# Patient Record
Sex: Male | Born: 1990 | Race: White | Hispanic: No | Marital: Married | Smoking: Never smoker
Health system: Southern US, Community
[De-identification: ages and names within clinical notes are randomized; demographics above are authoritative.]

## PROBLEM LIST (undated history)

## (undated) DIAGNOSIS — S83005A Unspecified dislocation of left patella, initial encounter: Secondary | ICD-10-CM

## (undated) HISTORY — DX: Unspecified dislocation of left patella, initial encounter: S83.005A

---
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
## 2014-06-23 ENCOUNTER — Ambulatory Visit
Admission: RE | Admit: 2014-06-23 | Discharge: 2014-06-23 | Disposition: A | Discharge status: Home / Self Care | Payer: BC Managed Care – PPO | Source: Ambulatory Visit | Attending: Family Medicine | Admitting: Family Medicine

## 2014-06-23 ENCOUNTER — Other Ambulatory Visit: Payer: Self-pay | Admitting: Family Medicine

## 2014-06-23 DIAGNOSIS — R0602 Shortness of breath: Secondary | ICD-10-CM

## 2014-06-23 DIAGNOSIS — R059 Cough, unspecified: Secondary | ICD-10-CM

## 2014-06-23 DIAGNOSIS — R05 Cough: Secondary | ICD-10-CM

## 2015-02-01 ENCOUNTER — Telehealth: Payer: Self-pay

## 2015-02-01 NOTE — Telephone Encounter (Signed)
Patient called into voicemail on Wed. (6/29) at 4:32pm stating he needs his Immunization records sent to him for his employer [employer]) he has a [employer] account with DOS 12/02/13.  His call back number is [phone] he will need to fill out a ROI and let us know the best way to send him his records.

## 2015-02-08 NOTE — Telephone Encounter (Signed)
Spoke with patient. He only has Tb test and Hep B in his chart DOS 12/02/13 from his [place] visit for [place]. Will copy for pickup. He is going out of town and will pick up records on Tuesday. Will sign ROI form as well.

## 2016-05-05 ENCOUNTER — Encounter: Payer: Self-pay | Admitting: Hematology

## 2016-05-05 ENCOUNTER — Telehealth: Payer: Self-pay | Admitting: Hematology

## 2016-05-05 NOTE — Telephone Encounter (Signed)
Appt scheduled with [name] on 11/2 at 2pm. Patient aware to arrive 30 minutes early. Demographics verified. Letter mailed to the patient.

## 2016-06-05 ENCOUNTER — Ambulatory Visit (HOSPITAL_BASED_OUTPATIENT_CLINIC_OR_DEPARTMENT_OTHER): Payer: BLUE CROSS/BLUE SHIELD

## 2016-06-05 ENCOUNTER — Telehealth: Payer: Self-pay | Admitting: Hematology

## 2016-06-05 ENCOUNTER — Encounter: Payer: Self-pay | Admitting: Hematology

## 2016-06-05 ENCOUNTER — Ambulatory Visit (HOSPITAL_BASED_OUTPATIENT_CLINIC_OR_DEPARTMENT_OTHER): Payer: BLUE CROSS/BLUE SHIELD | Admitting: Hematology

## 2016-06-05 VITALS — BP 132/83 | HR 94 | Temp 98.5°F | Resp 18 | Wt 231.5 lb

## 2016-06-05 DIAGNOSIS — D696 Thrombocytopenia, unspecified: Secondary | ICD-10-CM

## 2016-06-05 DIAGNOSIS — R000 Tachycardia, unspecified: Secondary | ICD-10-CM | POA: Diagnosis not present

## 2016-06-05 DIAGNOSIS — I10 Essential (primary) hypertension: Secondary | ICD-10-CM | POA: Insufficient documentation

## 2016-06-05 DIAGNOSIS — I471 Supraventricular tachycardia: Secondary | ICD-10-CM | POA: Insufficient documentation

## 2016-06-05 LAB — COMPREHENSIVE METABOLIC PANEL
ALBUMIN: 3.8 g/dL (ref 3.5–5.0)
ALK PHOS: 107 U/L (ref 40–150)
ALT: 42 U/L (ref 0–55)
ANION GAP: 8 meq/L (ref 3–11)
AST: 30 U/L (ref 5–34)
BILIRUBIN TOTAL: 0.66 mg/dL (ref 0.20–1.20)
BUN: 12.9 mg/dL (ref 7.0–26.0)
CO2: 25 mEq/L (ref 22–29)
Calcium: 9.3 mg/dL (ref 8.4–10.4)
Chloride: 107 mEq/L (ref 98–109)
Creatinine: 0.8 mg/dL (ref 0.7–1.3)
GLUCOSE: 96 mg/dL (ref 70–140)
POTASSIUM: 4 meq/L (ref 3.5–5.1)
SODIUM: 140 meq/L (ref 136–145)
TOTAL PROTEIN: 7.5 g/dL (ref 6.4–8.3)

## 2016-06-05 LAB — CBC & DIFF AND RETIC
BASO%: 1.2 % (ref 0.0–2.0)
Basophils Absolute: 0.1 10*3/uL (ref 0.0–0.1)
EOS%: 6.9 % (ref 0.0–7.0)
Eosinophils Absolute: 0.5 10*3/uL (ref 0.0–0.5)
HCT: 43.2 % (ref 38.4–49.9)
HEMOGLOBIN: 15.3 g/dL (ref 13.0–17.1)
Immature Retic Fract: 2.2 % — ABNORMAL LOW (ref 3.00–10.60)
LYMPH%: 30 % (ref 14.0–49.0)
MCH: 30.2 pg (ref 27.2–33.4)
MCHC: 35.4 g/dL (ref 32.0–36.0)
MCV: 85.2 fL (ref 79.3–98.0)
MONO#: 0.6 10*3/uL (ref 0.1–0.9)
MONO%: 7.7 % (ref 0.0–14.0)
NEUT%: 54.2 % (ref 39.0–75.0)
NEUTROS ABS: 4.1 10*3/uL (ref 1.5–6.5)
PLATELETS: 105 10*3/uL — AB (ref 140–400)
RBC: 5.07 10*6/uL (ref 4.20–5.82)
RDW: 12.6 % (ref 11.0–14.6)
Retic %: 1.28 % (ref 0.80–1.80)
Retic Ct Abs: 64.9 10*3/uL (ref 34.80–93.90)
WBC: 7.5 10*3/uL (ref 4.0–10.3)
lymph#: 2.3 10*3/uL (ref 0.9–3.3)

## 2016-06-05 LAB — LACTATE DEHYDROGENASE: LDH: 160 U/L (ref 125–245)

## 2016-06-05 LAB — CHCC SMEAR

## 2016-06-05 NOTE — Telephone Encounter (Signed)
Labs added for today per 06/05/16 los. Follow up appointment to be scheduled, depending on lab results, per Dr. Candise CheKale. No follow up appointment with M.d. Requested at this time, per 06/05/16.

## 2016-06-05 NOTE — Patient Instructions (Addendum)
-  Would avoid alcohol use until we are reassured that your platelet counts are stable. -Avoid over-the-counter NSAIDs such as ibuprofen and aspirin Advil Aleve Motrin naproxen. Tylenol would be okay. -We will review the lab tests from today and call you with the results and plan for follow-up. -reasonable to take daily multivitamin

## 2016-06-06 LAB — HEPATITIS C ANTIBODY: Hep C Virus Ab: <0.1 s/co ratio (ref 0.0–0.9)

## 2016-06-06 LAB — HIV ANTIBODY (ROUTINE TESTING W REFLEX): HIV Screen 4th Generation wRfx: NONREACTIVE

## 2016-06-06 LAB — SEDIMENTATION RATE: Sedimentation Rate-Westergren: 7 mm/hr (ref 0–15)

## 2016-07-04 ENCOUNTER — Telehealth: Payer: Self-pay

## 2016-07-04 NOTE — Telephone Encounter (Signed)
Patient came in today requesting lab results from 06/05/16. Labs review by MD and given to pt. Pt requested information about next appointment, states he has been in multiple times and called and is unsure of what to do next. Spoke with MD who states pt will most likely be referred back to PCP for follow ups and our office will call him with this information. Informed pt of this and informed pt call back in a week or two if he doesn't hear anything back. Pt verbalized understanding.

## 2016-12-07 NOTE — Progress Notes (Signed)
Marland Kitchen    HEMATOLOGY/ONCOLOGY CONSULTATION NOTE  Date of Service: .06/05/2016  PCP:  Harlan Stains MD CHIEF COMPLAINTS/PURPOSE OF CONSULTATION:  Thrombocytopenia  HISTORY OF PRESENTING ILLNESS:  JOHNCHARLES FUSSELMAN is a wonderful 26 y.o. male who has been referred to [place] by Dr Harlan Stains MD for evaluation and management of thrombocytopenia.  Patient has a history of hypertension, supraventricular tachycardia, obesity and has been in his usual state of health. Patient recently had labs with his primary care physician on 04/25/2016 that showed degrees platelet count of 85k with a normal hemoglobin of 15.6 with an MCV of 86 and normal WBC count of 7.6k.  Labs previous to this from 04/10/2016 showed a platelet count of 102k  Patient has not had any issues with bleeding excessive bruising. No gum bleeds no epistaxis. No GI bleeding. No hematuria.  He notes that he has been using over-the-counter ibuprofen for his knee pain as needed and has also been on and over-the-counter PPI for acid reflux. We discussed that both of these have been associated with thrombocytopenia.  He notes that he also had foot poisoning about one half weeks ago.  Has pets at home but denies any tick bites. Notes that he had noted swollen painful arm in mid-September and did have some flulike symptoms.  No fevers no chills no night sweats. No enlarged lymph nodes. No abdominal pain or distention.  MEDICAL HISTORY:   #1 hypertension patient is on metoprolol and noted causes some fatigue #2 supraventricular tachycardias 1-2 times per month #3 obesity #4 dislocated patella #5 external hemorrhoid December 2012  SURGICAL HISTORY: No previous surgeries   SOCIAL HISTORY: Social History   Social History  . Marital status: Married    Spouse name: N/A  . Number of children: N/A  . Years of education: N/A   Occupational History  . Not on file.   Social History Main Topics  . Smoking status: Never Smoker  . Smokeless  tobacco: Never Used  . Alcohol use Yes     Comment: occasionally  . Drug use: No  . Sexual activity: Not on file   Other Topics Concern  . Not on file   Social History Narrative  . No narrative on file  Patient is recently married Nonsmoker Social alcohol use No recreational drug use  FAMILY HISTORY:  Paternal grandmother -breast cancer  Maternal great-grandmother breast cancer Father basal cell carcinoma/squamous cell carcinoma Mom brain and spinal benign tumors.   ALLERGIES:  has No Known Allergies.  MEDICATIONS:  Current Outpatient Prescriptions  Medication Sig Dispense Refill  . metoprolol tartrate (LOPRESSOR) 25 MG tablet Take 25 mg by mouth 2 (two) times daily.     No current facility-administered medications for this visit.   Ibuprofen and over-the-counter PPIs as needed.   REVIEW OF SYSTEMS:    10 Point review of Systems was done is negative except as noted above.  PHYSICAL EXAMINATION: ECOG PERFORMANCE STATUS: 0 - Asymptomatic  . Vitals:   06/05/16 1420  BP: 132/83  Pulse: 94  Resp: 18  Temp: 98.5 F (36.9 C)   Filed Weights   06/05/16 1420  Weight: 231 lb 8 oz (105 kg)   .There is no height or weight on file to calculate BMI.  GENERAL:alert, in no acute distress and comfortable SKIN: no acute rashes, no significant lesions EYES: conjunctiva are pink and non-injected, sclera anicteric OROPHARYNX: MMM, no exudates, no oropharyngeal erythema or ulceration NECK: supple, no JVD LYMPH:  no palpable lymphadenopathy in the  cervical, axillary or inguinal regions LUNGS: clear to auscultation b/l with normal respiratory effort HEART: regular rate & rhythm ABDOMEN:  normoactive bowel sounds , non tender, not distended. No palpable hepatosplenomegaly. Extremity: no pedal edema PSYCH: alert & oriented x 3 with fluent speech NEURO: no focal motor/sensory deficits  LABORATORY DATA:  I have reviewed the data as listed  . CBC Latest Ref Rng & Units  06/05/2016  WBC 4.0 - 10.3 10e3/uL 7.5  Hemoglobin 13.0 - 17.1 g/dL 15.3  Hematocrit 38.4 - 49.9 % 43.2  Platelets 140 - 400 10e3/uL 105(L)    . CMP Latest Ref Rng & Units 06/05/2016  Glucose 70 - 140 mg/dl 96  BUN 7.0 - 26.0 mg/dL 12.9  Creatinine 0.7 - 1.3 mg/dL 0.8  Sodium 136 - 145 mEq/L 140  Potassium 3.5 - 5.1 mEq/L 4.0  CO2 22 - 29 mEq/L 25  Calcium 8.4 - 10.4 mg/dL 9.3  Total Protein 6.4 - 8.3 g/dL 7.5  Total Bilirubin 0.20 - 1.20 mg/dL 0.66  Alkaline Phos 40 - 150 U/L 107  AST 5 - 34 U/L 30  ALT 0 - 55 U/L 42   Component     Latest Ref Rng & Units 06/05/2016  LDH     125 - 245 U/L 160  Sed Rate     0 - 15 mm/hr 7  HIV     Non Reactive Non Reactive  Hep C Virus Ab     0.0 - 0.9 s/co ratio <0.1   RADIOGRAPHIC STUDIES: I have personally reviewed the radiological images as listed and agreed with the findings in the report. No results found.  ASSESSMENT & PLAN:   26 year old male with  #1 mild asymptomatic thrombocytopenic. His platelet count today is 105k with no other CBC abnormalities. Since September his platelet counts have varied between 85-105k with no issues with bleeding or bruising.  This could be due to his recent flulike illness in September, the use of over-the-counter medications including ibuprofen and PPIs or possibly his other medications including metoprolol.  This could also represent an early immune thrombocytopenia do at this point his counts are more than 100k and it would not meet the criteria for ITP but could evolve into this.  HIV, hepatitis C negative LDH is within normal limits and suggests no concurrent hemolysis or evidence of lymphoproliferative disorder. Sedimentation rate is within normal limits. No clinical evidence of autoimmune condition.  Plan -Lab results were discussed in detail with the patient. -No overt platelet clumping on peripheral blood smear to suggest pseudothrombocytopenia -Patient was recommended to avoid using  ibuprofen-like NSAIDs and could use acetaminophen instead for pain. -No indication for bone marrow biopsy at this time. -Repeat CBC in 3-4 months with primary care physician . If still more than 100 k could monitor platelets every 6 months . -Please reconsult [place] if platelet counts dropped to below 75 k -Patient advised to empirically take over-the-counter multivitamin .  Return to clinic with Dr. Irene Limbo on as-needed basis   All of the patients questions were answered with apparent satisfaction. The patient knows to call the clinic with any problems, questions or concerns.  I spent 40 minutes counseling the patient face to face. The total time spent in the appointment was 50 minutes and more than 50% was on counseling and direct patient cares.    Sullivan Lone MD [place] AAHIVMS [facility] Hematology/Oncology Physician [facility]  (Office):       [phone] (Work cell):  [phone] (Fax):           [phone]

## 2019-06-27 ENCOUNTER — Emergency Department
Admission: EM | Admit: 2019-06-27 | Discharge: 2019-06-27 | Discharge status: Absent without leave | Payer: BLUE CROSS/BLUE SHIELD

## 2019-06-27 ENCOUNTER — Other Ambulatory Visit
Admission: RE | Admit: 2019-06-27 | Discharge: 2019-06-27 | Disposition: A | Discharge status: Home / Self Care | Payer: Self-pay | Source: Ambulatory Visit | Attending: Family Medicine | Admitting: Family Medicine

## 2021-08-15 ENCOUNTER — Other Ambulatory Visit: Payer: Self-pay | Admitting: Physician Assistant

## 2021-08-15 ENCOUNTER — Ambulatory Visit
Admission: RE | Admit: 2021-08-15 | Discharge: 2021-08-15 | Disposition: A | Discharge status: Home / Self Care | Payer: BC Managed Care – PPO | Source: Ambulatory Visit | Attending: Physician Assistant | Admitting: Physician Assistant

## 2021-08-15 DIAGNOSIS — S99911A Unspecified injury of right ankle, initial encounter: Secondary | ICD-10-CM

## 2021-08-15 DIAGNOSIS — M25471 Effusion, right ankle: Secondary | ICD-10-CM

## 2022-08-29 IMAGING — CR DG ANKLE COMPLETE 3+V*R*
3 series · 3 of 3 positions shown · non-contrast
Comparison: None.

CLINICAL DATA: Right ankle pain.

EXAM:
RIGHT ANKLE - COMPLETE 3+ VIEW

[t ankle joint ap right (1 of 2)]
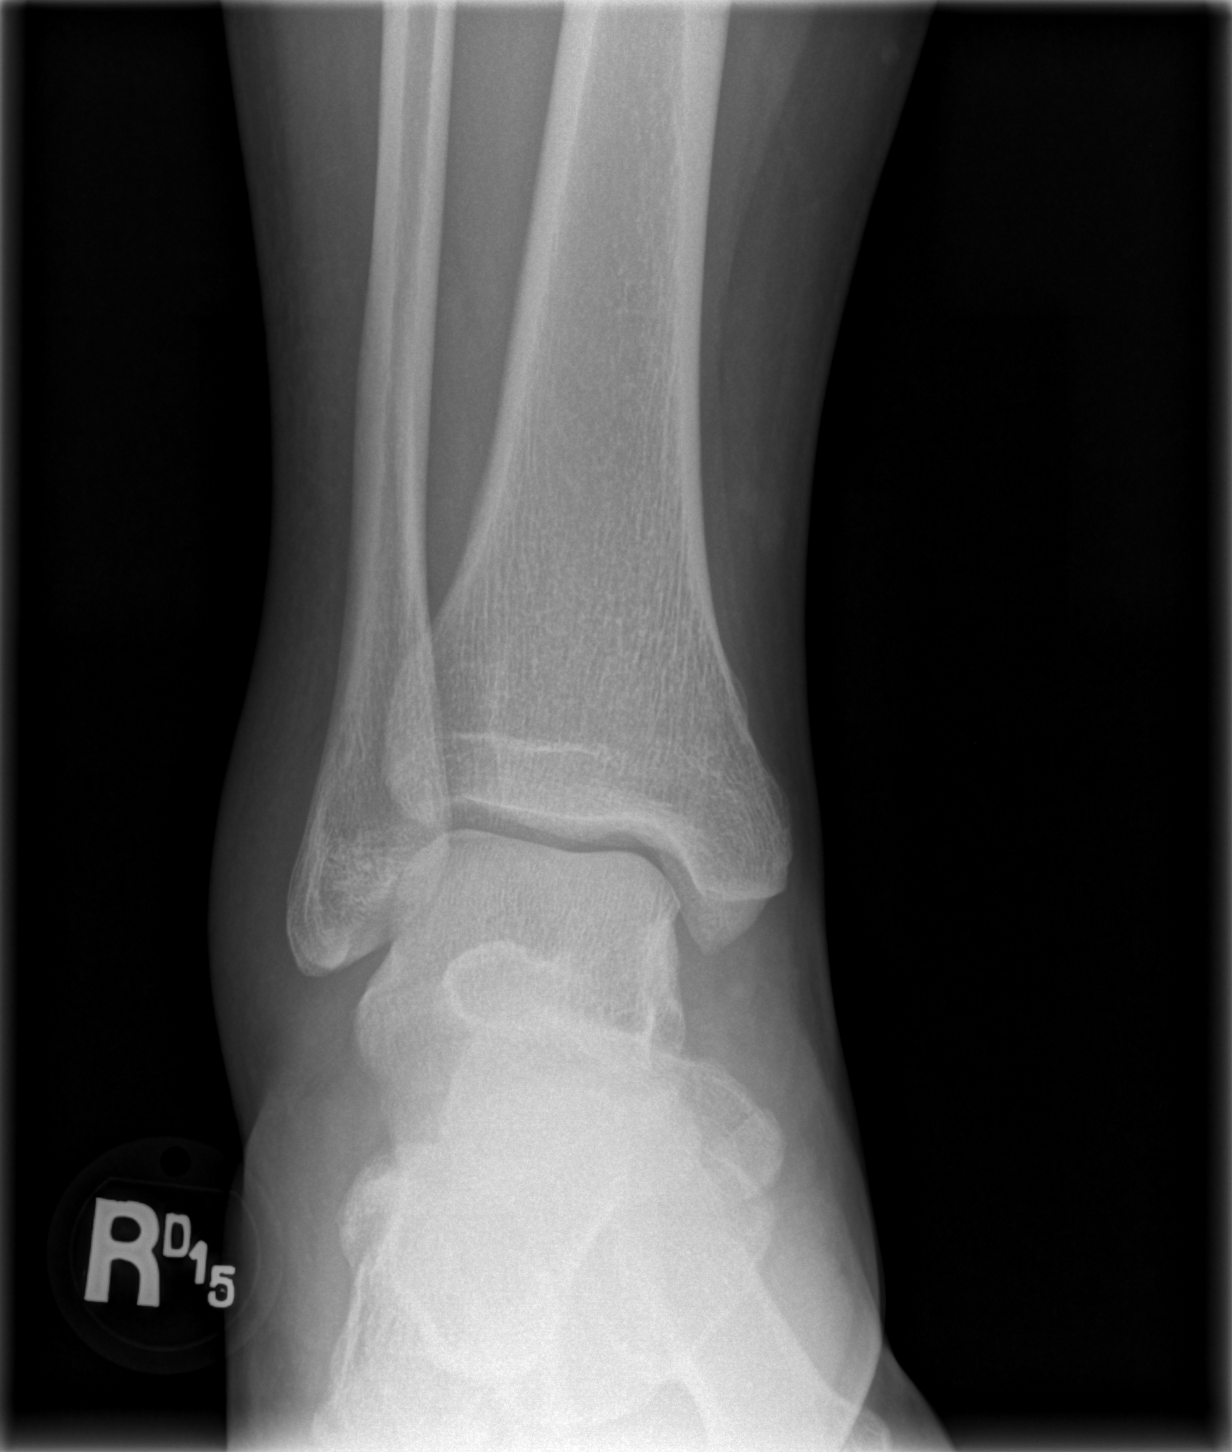

[t ankle joint ap right (2 of 2)]
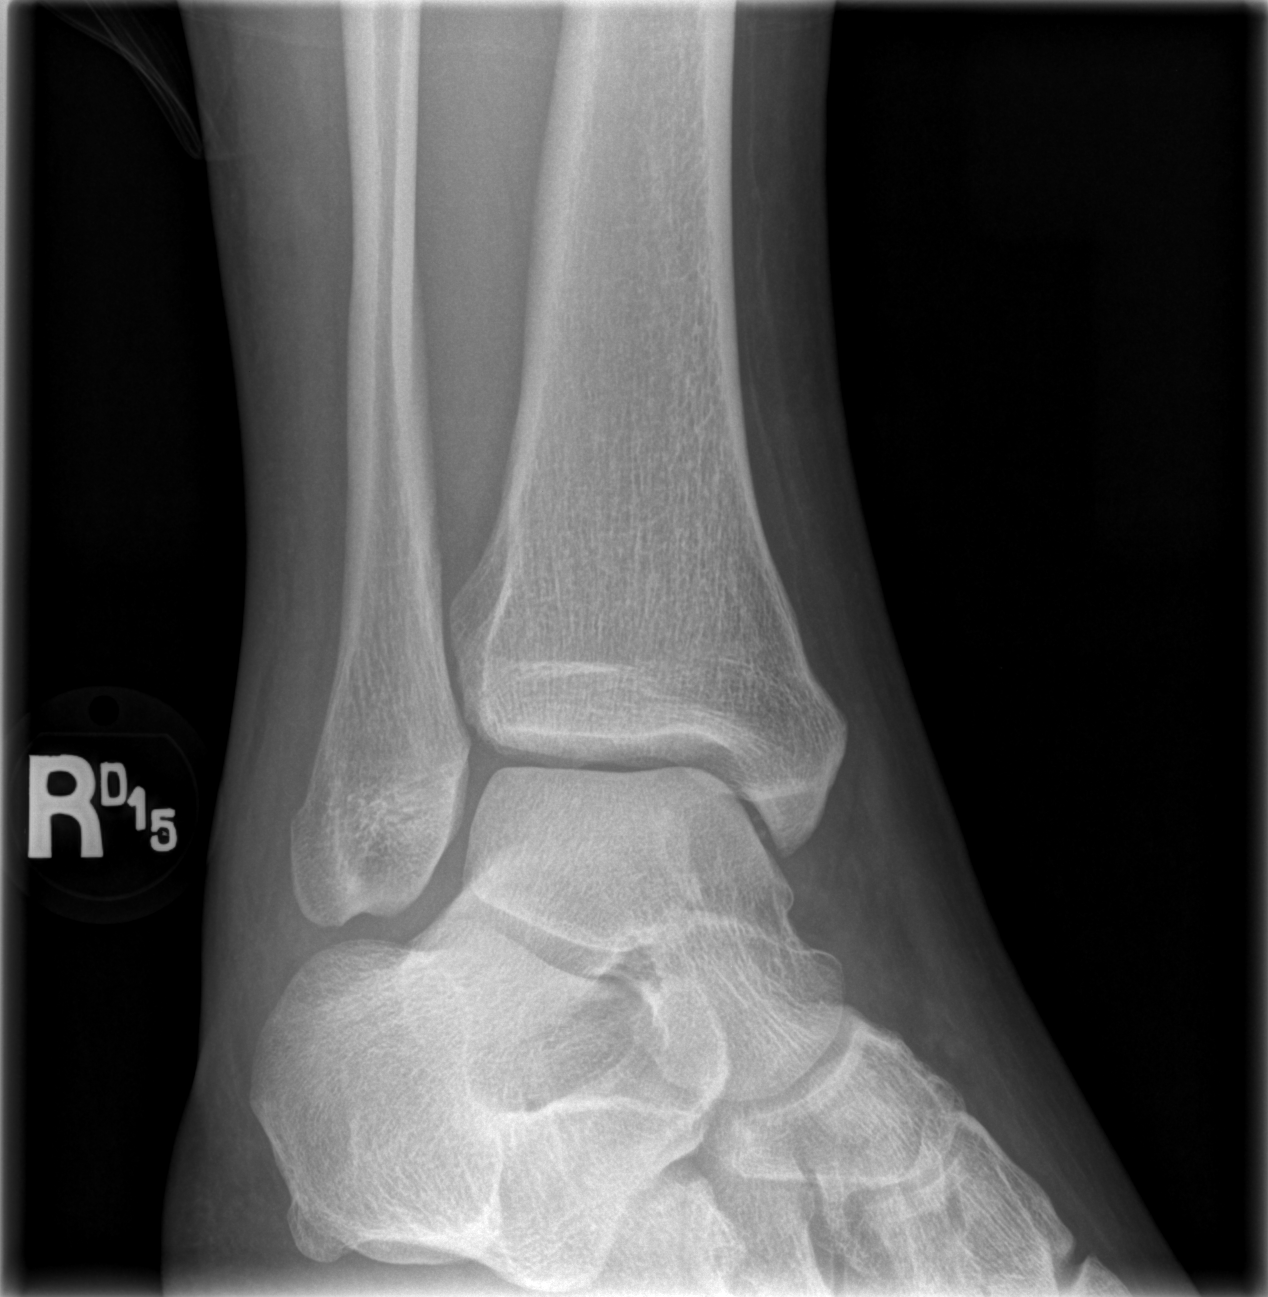

[t ankle joint lat right]
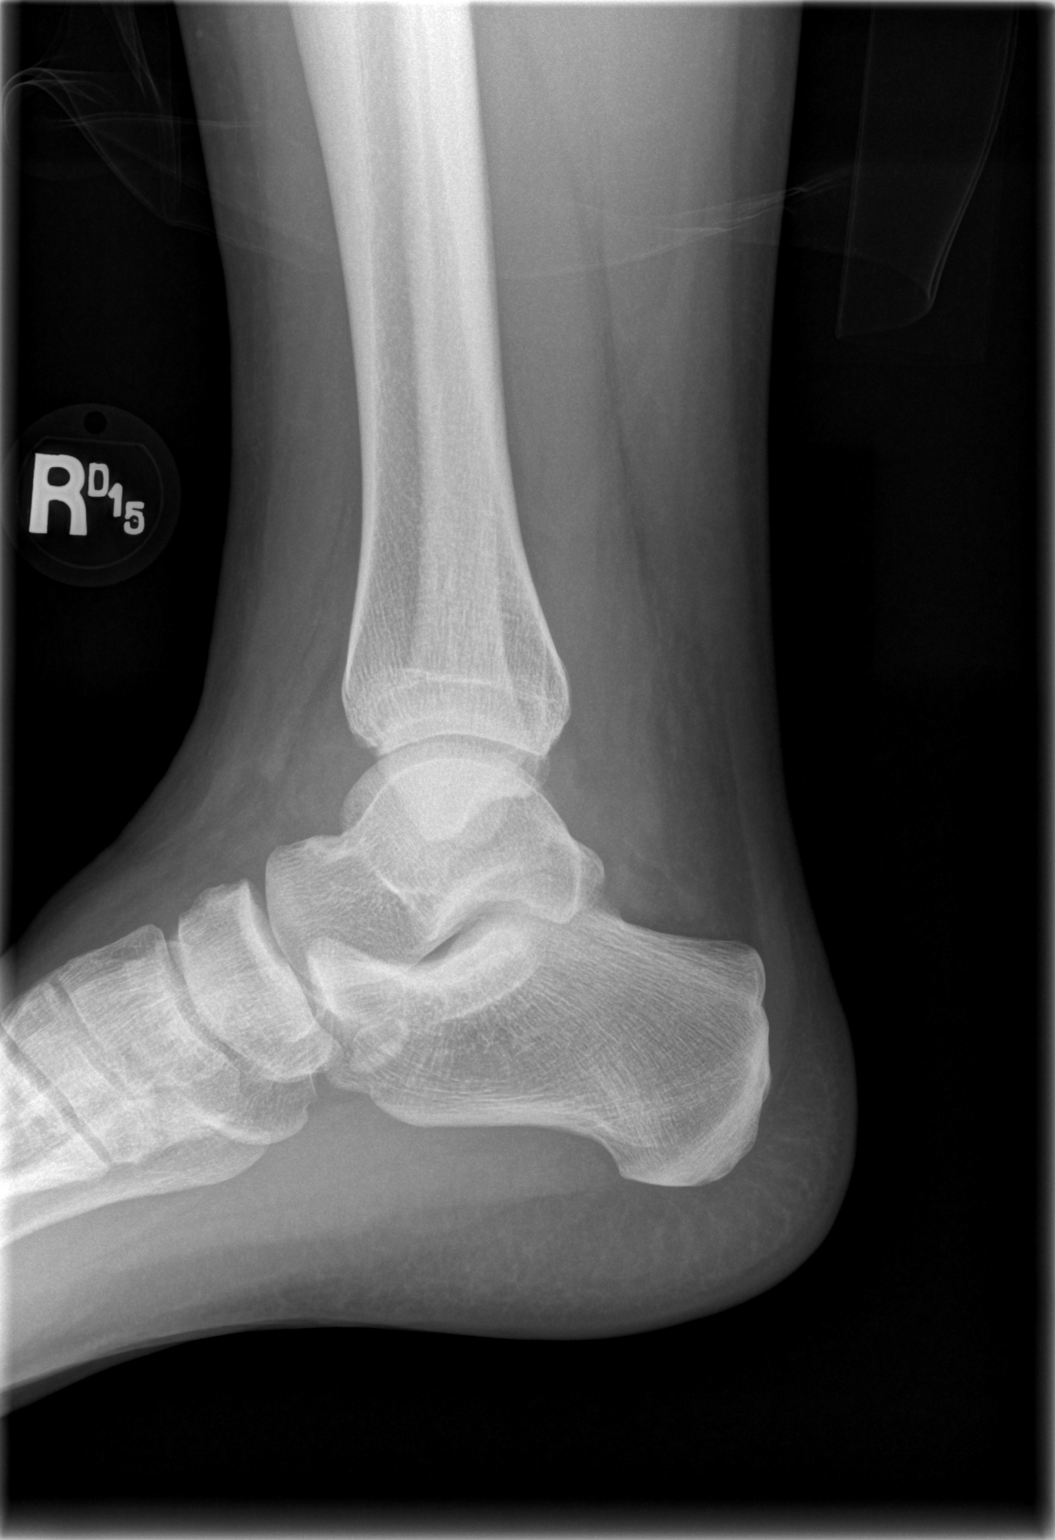

[3 of 3 positions shown; findings below may reference images not displayed]

FINDINGS: There is no acute fracture or dislocation. The bones are well
mineralized. The ankle mortise is intact. There is mild soft tissue
swelling over the lateral malleolus.
IMPRESSION: No acute fracture or dislocation.
